# Patient Record
Sex: Female | Born: 1955 | Race: Black or African American | Hispanic: No | Marital: Single | Smoking: Current every day smoker
Health system: Southern US, Community
[De-identification: ages and names within clinical notes are randomized; demographics above are authoritative.]

## PROBLEM LIST (undated history)

## (undated) DIAGNOSIS — I10 Essential (primary) hypertension: Secondary | ICD-10-CM

## (undated) HISTORY — PX: TUBAL LIGATION: SHX77

---

## 2004-02-15 ENCOUNTER — Emergency Department: Payer: Self-pay | Admitting: Emergency Medicine

## 2018-06-12 ENCOUNTER — Emergency Department: Payer: Self-pay

## 2018-06-12 ENCOUNTER — Inpatient Hospital Stay
Admission: EM | Admit: 2018-06-12 | Discharge: 2018-06-15 | DRG: 871 | Disposition: A | Discharge status: Home / Self Care | Payer: Self-pay | Attending: Internal Medicine | Admitting: Internal Medicine

## 2018-06-12 ENCOUNTER — Other Ambulatory Visit: Payer: Self-pay

## 2018-06-12 ENCOUNTER — Encounter: Payer: Self-pay | Admitting: Emergency Medicine

## 2018-06-12 DIAGNOSIS — F17210 Nicotine dependence, cigarettes, uncomplicated: Secondary | ICD-10-CM | POA: Diagnosis present

## 2018-06-12 DIAGNOSIS — E876 Hypokalemia: Secondary | ICD-10-CM | POA: Diagnosis present

## 2018-06-12 DIAGNOSIS — N3090 Cystitis, unspecified without hematuria: Secondary | ICD-10-CM | POA: Diagnosis present

## 2018-06-12 DIAGNOSIS — I10 Essential (primary) hypertension: Secondary | ICD-10-CM | POA: Diagnosis present

## 2018-06-12 DIAGNOSIS — N12 Tubulo-interstitial nephritis, not specified as acute or chronic: Secondary | ICD-10-CM | POA: Diagnosis present

## 2018-06-12 DIAGNOSIS — R1032 Left lower quadrant pain: Secondary | ICD-10-CM

## 2018-06-12 DIAGNOSIS — Z8249 Family history of ischemic heart disease and other diseases of the circulatory system: Secondary | ICD-10-CM

## 2018-06-12 DIAGNOSIS — Z8042 Family history of malignant neoplasm of prostate: Secondary | ICD-10-CM

## 2018-06-12 DIAGNOSIS — N170 Acute kidney failure with tubular necrosis: Secondary | ICD-10-CM | POA: Diagnosis present

## 2018-06-12 DIAGNOSIS — A419 Sepsis, unspecified organism: Secondary | ICD-10-CM | POA: Diagnosis present

## 2018-06-12 DIAGNOSIS — A4151 Sepsis due to Escherichia coli [E. coli]: Principal | ICD-10-CM | POA: Diagnosis present

## 2018-06-12 HISTORY — DX: Essential (primary) hypertension: I10

## 2018-06-12 LAB — URINALYSIS, COMPLETE (UACMP) WITH MICROSCOPIC
Bilirubin Urine: NEGATIVE
Glucose, UA: NEGATIVE mg/dL
Ketones, ur: NEGATIVE mg/dL
Nitrite: POSITIVE — AB
Protein, ur: 100 mg/dL — AB
Specific Gravity, Urine: 1.01 (ref 1.005–1.030)
WBC, UA: >50 WBC/hpf — ABNORMAL HIGH (ref 0–5)
pH: 6 (ref 5.0–8.0)

## 2018-06-12 LAB — COMPREHENSIVE METABOLIC PANEL
ALT: 31 U/L (ref 0–44)
AST: 43 U/L — ABNORMAL HIGH (ref 15–41)
Albumin: 3.6 g/dL (ref 3.5–5.0)
Alkaline Phosphatase: 69 U/L (ref 38–126)
Anion gap: 10 (ref 5–15)
BUN: 20 mg/dL (ref 8–23)
CO2: 23 mmol/L (ref 22–32)
Calcium: 8.8 mg/dL — ABNORMAL LOW (ref 8.9–10.3)
Chloride: 103 mmol/L (ref 98–111)
Creatinine, Ser: 1.34 mg/dL — ABNORMAL HIGH (ref 0.44–1.00)
GFR calc Af Amer: 49 mL/min — ABNORMAL LOW (ref >60)
GFR calc non Af Amer: 42 mL/min — ABNORMAL LOW (ref >60)
GLUCOSE: 160 mg/dL — AB (ref 70–99)
Potassium: 2.8 mmol/L — ABNORMAL LOW (ref 3.5–5.1)
Sodium: 136 mmol/L (ref 135–145)
Total Bilirubin: 1.6 mg/dL — ABNORMAL HIGH (ref 0.3–1.2)
Total Protein: 8 g/dL (ref 6.5–8.1)

## 2018-06-12 LAB — CBC
HCT: 39.2 % (ref 36.0–46.0)
HEMOGLOBIN: 12.9 g/dL (ref 12.0–15.0)
MCH: 30.6 pg (ref 26.0–34.0)
MCHC: 32.9 g/dL (ref 30.0–36.0)
MCV: 92.9 fL (ref 80.0–100.0)
Platelets: 247 10*3/uL (ref 150–400)
RBC: 4.22 MIL/uL (ref 3.87–5.11)
RDW: 14.2 % (ref 11.5–15.5)
WBC: 25.7 10*3/uL — ABNORMAL HIGH (ref 4.0–10.5)
nRBC: 0 % (ref 0.0–0.2)

## 2018-06-12 LAB — LACTIC ACID, PLASMA: Lactic Acid, Venous: 1.5 mmol/L (ref 0.5–1.9)

## 2018-06-12 LAB — TSH: TSH: 0.556 u[IU]/mL (ref 0.350–4.500)

## 2018-06-12 LAB — LIPASE, BLOOD: Lipase: 22 U/L (ref 11–51)

## 2018-06-12 MED ORDER — ONDANSETRON HCL 4 MG/2ML IJ SOLN
4.0000 mg | Freq: Once | INTRAMUSCULAR | Status: AC
  Administered 2018-06-12: 4 mg via INTRAVENOUS
  Filled 2018-06-12: qty 2

## 2018-06-12 MED ORDER — SODIUM CHLORIDE 0.9 % IV BOLUS
1000.0000 mL | Freq: Once | INTRAVENOUS | Status: AC
  Administered 2018-06-12: 1000 mL via INTRAVENOUS

## 2018-06-12 MED ORDER — ENOXAPARIN SODIUM 40 MG/0.4ML ~~LOC~~ SOLN
40.0000 mg | SUBCUTANEOUS | Status: DC
  Administered 2018-06-12 – 2018-06-14 (×3): 40 mg via SUBCUTANEOUS
  Filled 2018-06-12 (×3): qty 0.4

## 2018-06-12 MED ORDER — ONDANSETRON HCL 4 MG PO TABS: 4.0000 mg | ORAL_TABLET | Freq: Four times a day (QID) | ORAL | Status: DC | PRN

## 2018-06-12 MED ORDER — SODIUM CHLORIDE 0.9 % IV SOLN
2.0000 g | INTRAVENOUS | Status: DC
  Administered 2018-06-12: 2 g via INTRAVENOUS
  Filled 2018-06-12 (×2): qty 20

## 2018-06-12 MED ORDER — IOHEXOL 350 MG/ML SOLN: 75.0000 mL | Freq: Once | INTRAVENOUS | Status: DC | PRN

## 2018-06-12 MED ORDER — POTASSIUM CHLORIDE CRYS ER 20 MEQ PO TBCR
40.0000 meq | EXTENDED_RELEASE_TABLET | ORAL | Status: AC
  Administered 2018-06-12 (×2): 40 meq via ORAL
  Filled 2018-06-12 (×2): qty 2

## 2018-06-12 MED ORDER — SODIUM CHLORIDE 0.9 % IV SOLN
INTRAVENOUS | Status: DC
  Administered 2018-06-12 – 2018-06-14 (×4): via INTRAVENOUS

## 2018-06-12 MED ORDER — ACETAMINOPHEN 325 MG PO TABS
650.0000 mg | ORAL_TABLET | Freq: Four times a day (QID) | ORAL | Status: DC | PRN
  Administered 2018-06-12 – 2018-06-14 (×4): 650 mg via ORAL
  Filled 2018-06-12 (×4): qty 2

## 2018-06-12 MED ORDER — IOHEXOL 300 MG/ML  SOLN
75.0000 mL | Freq: Once | INTRAMUSCULAR | Status: AC | PRN
  Administered 2018-06-12: 75 mL via INTRAVENOUS

## 2018-06-12 MED ORDER — MORPHINE SULFATE (PF) 4 MG/ML IV SOLN
4.0000 mg | Freq: Once | INTRAVENOUS | Status: AC
  Administered 2018-06-12: 4 mg via INTRAVENOUS
  Filled 2018-06-12: qty 1

## 2018-06-12 MED ORDER — SODIUM CHLORIDE 0.9 % IV SOLN: 2.0000 g | INTRAVENOUS | Status: DC

## 2018-06-12 MED ORDER — ACETAMINOPHEN 325 MG PO TABS
650.0000 mg | ORAL_TABLET | Freq: Once | ORAL | Status: AC
  Administered 2018-06-12: 650 mg via ORAL
  Filled 2018-06-12: qty 2

## 2018-06-12 MED ORDER — METRONIDAZOLE IN NACL 5-0.79 MG/ML-% IV SOLN
500.0000 mg | Freq: Three times a day (TID) | INTRAVENOUS | Status: DC
  Administered 2018-06-12 – 2018-06-13 (×3): 500 mg via INTRAVENOUS
  Filled 2018-06-12 (×5): qty 100

## 2018-06-12 MED ORDER — ACETAMINOPHEN 650 MG RE SUPP: 650.0000 mg | Freq: Four times a day (QID) | RECTAL | Status: DC | PRN

## 2018-06-12 MED ORDER — SODIUM CHLORIDE 0.9% FLUSH
3.0000 mL | Freq: Once | INTRAVENOUS | Status: AC
  Administered 2018-06-12: 3 mL via INTRAVENOUS

## 2018-06-12 MED ORDER — ONDANSETRON HCL 4 MG/2ML IJ SOLN: 4.0000 mg | Freq: Four times a day (QID) | INTRAMUSCULAR | Status: DC | PRN

## 2018-06-12 MED ORDER — POTASSIUM CHLORIDE 10 MEQ/100ML IV SOLN
10.0000 meq | INTRAVENOUS | Status: DC
  Administered 2018-06-12 (×2): 10 meq via INTRAVENOUS
  Filled 2018-06-12 (×2): qty 100

## 2018-06-12 NOTE — Progress Notes (Signed)
CODE SEPSIS - PHARMACY COMMUNICATION  **Broad Spectrum Antibiotics should be administered within 1 hour of Sepsis diagnosis**  Time Code Sepsis Called/Page Received: 10:53  Antibiotics Ordered: Ceftriaxone  Time of 1st antibiotic administration: 11:36  Additional action taken by pharmacy: N/A  If necessary, Name of Provider/Nurse Contacted: N/A    Foye Deer ,PharmD Clinical Pharmacist  06/12/2018  11:56 AM

## 2018-06-12 NOTE — ED Provider Notes (Signed)
[facility] Emergency Department Provider Note  ____________________________________________  Time seen: Approximately 3:03 PM  I have reviewed the triage vital signs and the nursing notes.   HISTORY  Chief Complaint Abdominal Pain    HPI Ashlee Harper is a 63 y.o. female with a history of hypertension complains of left-sided abdominal pain for the past 2 days, gradual onset, worsening.  Associated with nausea and malaise.  No vomiting diarrhea or constipation.  No aggravating or alleviating factors.  Nonradiating    Past Medical History:  Diagnosis Date  . Hypertension      There are no active problems to display for this patient.    History reviewed. No pertinent surgical history.   Prior to Admission medications   Not on File  Noncontributory   Allergies Patient has no known allergies.   No family history on file.  Social History Social History   Tobacco Use  . Smoking status: Current Every Day Smoker  . Smokeless tobacco: Never Used  Substance Use Topics  . Alcohol use: Never    Frequency: Never  . Drug use: Not on file    Review of Systems  Constitutional:   No fever or chills.  ENT:   No sore throat. No rhinorrhea. Cardiovascular:   No chest pain or syncope. Respiratory:   No dyspnea or cough. Gastrointestinal: Positive as above for abdominal pain without vomiting and diarrhea.  Musculoskeletal:   Negative for focal pain or swelling All other systems reviewed and are negative except as documented above in ROS and HPI.  ____________________________________________   PHYSICAL EXAM:  VITAL SIGNS: ED Triage Vitals  Enc Vitals Group     BP 06/12/18 0839 130/74     Pulse Rate 06/12/18 0839 (!) 119     Resp 06/12/18 0839 16     Temp 06/12/18 0839 99.7 F (37.6 C)     Temp Source 06/12/18 0839 Oral     SpO2 06/12/18 0839 99 %     Weight 06/12/18 0840 130 lb (59 kg)     Height 06/12/18 0840 5\' 2"  (1.575 m)      Head Circumference --      Peak Flow --      Pain Score 06/12/18 0840 8     Pain Loc --      Pain Edu? --      Excl. in GC? --     Vital signs reviewed, nursing assessments reviewed.   Constitutional:   Alert and oriented. Non-toxic appearance. Eyes:   Conjunctivae are normal. EOMI. PERRL. ENT      Head:   Normocephalic and atraumatic.      Nose:   No congestion/rhinnorhea.       Mouth/Throat:   Dry mucous membranes, no pharyngeal erythema. No peritonsillar mass.       Neck:   No meningismus. Full ROM. Hematological/Lymphatic/Immunilogical:   No cervical lymphadenopathy. Cardiovascular:   Tachycardia rate 100. Symmetric bilateral radial and DP pulses.  No murmurs. Cap refill less than 2 seconds. Respiratory:   Normal respiratory effort without tachypnea/retractions. Breath sounds are clear and equal bilaterally. No wheezes/rales/rhonchi. Gastrointestinal:   Soft with diffuse left-sided abdominal tenderness. Non distended. There is no CVA tenderness.  No rebound, rigidity, or guarding. Musculoskeletal:   Normal range of motion in all extremities. No joint effusions.  No lower extremity tenderness.  No edema. Neurologic:   Normal speech and language.  Motor grossly intact. No acute focal neurologic deficits are appreciated.  Skin:  Skin is warm, dry and intact. No rash noted.  No petechiae, purpura, or bullae.  ____________________________________________    LABS (pertinent positives/negatives) (all labs ordered are listed, but only abnormal results are displayed) Labs Reviewed  COMPREHENSIVE METABOLIC PANEL - Abnormal; Notable for the following components:      Result Value   Potassium 2.8 (*)    Glucose, Bld 160 (*)    Creatinine, Ser 1.34 (*)    Calcium 8.8 (*)    AST 43 (*)    Total Bilirubin 1.6 (*)    GFR calc non Af Amer 42 (*)    GFR calc Af Amer 49 (*)    All other components within normal limits  CBC - Abnormal; Notable for the following components:   WBC 25.7  (*)    All other components within normal limits  URINE CULTURE  LIPASE, BLOOD  LACTIC ACID, PLASMA  URINALYSIS, COMPLETE (UACMP) WITH MICROSCOPIC   ____________________________________________   EKG    ____________________________________________    RADIOLOGY  Ct Abdomen Pelvis W Contrast  Result Date: 06/12/2018 CLINICAL DATA:  Left-sided pain for 2 days. Denies urinary symptoms. Normal bowel movements. Current smoker. EXAM: CT ABDOMEN AND PELVIS WITH CONTRAST TECHNIQUE: Multidetector CT imaging of the abdomen and pelvis was performed using the standard protocol following bolus administration of intravenous contrast. CONTRAST:  53mL OMNIPAQUE IOHEXOL 300 MG/ML  SOLN COMPARISON:  None. FINDINGS: Lower chest: Bibasilar subsegmental atelectasis. Normal heart size with trace left pleural fluid. Hepatobiliary: Normal liver. Normal gallbladder, without biliary duct dilatation. Pancreas: Normal, without mass or ductal dilatation. Spleen: Normal in size, without focal abnormality. Adrenals/Urinary Tract: Mild left greater than right adrenal thickening. Lower pole right renal 1.3 cm cyst. Upper pole left renal 4.0 cm cyst. Edema of the left kidney, with relatively diffuse heterogeneous enhancement. Most apparent on delayed images. More mild heterogeneous enhancement of the right kidney, including anteriorly on image 14/7. Left perinephric interstitial thickening.  No hydronephrosis. Air within the urinary bladder with diffuse mild pelvic edema. Example image 69/2. Bladder dome wall thickening, eccentric left on coronal image 30. Stomach/Bowel: Normal stomach, without wall thickening. Colonic diverticulosis, especially in the sigmoid. Possible extraluminal gas between the sigmoid and bladder on image 67/2. Normal small bowel. Vascular/Lymphatic: Advanced aortic and branch vessel atherosclerosis. No abdominopelvic adenopathy. Reproductive: Normal uterus and adnexa. Other: No significant free fluid.   Moderate pelvic floor laxity. Musculoskeletal: Lumbosacral spondylosis, including degenerative disc disease at L4-5 and a disc bulge at L3-4. IMPRESSION: 1. Left greater than right pyelonephritis. 2. Bladder wall thickening and diffuse pelvic edema, likely indicative of cystitis. Air within the bladder. Unless there has been recent instrumentation, this would suggest fistulous communication to bowel, presumably secondary to diverticulitis. 3.  Aortic Atherosclerosis (ICD10-I70.0). 4. Pelvic floor laxity. Electronically Signed   By: Jeronimo Greaves M.D.   On: 06/12/2018 15:02    ____________________________________________   PROCEDURES .Critical Care Performed by: Sharman Cheek, MD Authorized by: Sharman Cheek, MD   Critical care provider statement:    Critical care time (minutes):  35   Critical care time was exclusive of:  Separately billable procedures and treating other patients   Critical care was necessary to treat or prevent imminent or life-threatening deterioration of the following conditions:  Sepsis   Critical care was time spent personally by me on the following activities:  Development of treatment plan with patient or surrogate, discussions with consultants, evaluation of patient's response to treatment, examination of patient, obtaining history from patient or surrogate, ordering  and performing treatments and interventions, ordering and review of laboratory studies, ordering and review of radiographic studies, pulse oximetry, re-evaluation of patient's condition and review of old charts    ____________________________________________  DIFFERENTIAL DIAGNOSIS   Diverticulitis, bowel obstruction, bowel perforation, intra-abdominal abscess, pancreatitis.  Doubt AAA dissection or mesenteric ischemia.  Doubt biliary disease or appendicitis.  CLINICAL IMPRESSION / ASSESSMENT AND PLAN / ED COURSE  Pertinent labs & imaging results that were available during my care of the patient  were reviewed by me and considered in my medical decision making (see chart for details).      Clinical Course as of Jun 13 1527  Tue Jun 12, 2018  1051 Temp 102.2 on my exam. Will check lactate, b. Cx, give antibiotics and obtain CT.    [PS]  1148 Nurse reports only 1 set of cultures obtained due to inability to obtain second set after 3 additional attempts. Antibiotics initiated to avoid delay.   [PS]  1507 CT shows bilateral pyelonephritis.  With her signs of sepsis including severe leukocytosis, fever, tachycardia, patient will need to be hospitalized for further management.  Continue IV fluids for hydration.   [PS]    Clinical Course User Index [PS] Sharman CheekStafford, Vasily Fedewa, MD     ____________________________________________   FINAL CLINICAL IMPRESSION(S) / ED DIAGNOSES    Final diagnoses:  Left lower quadrant abdominal pain  Sepsis, due to unspecified organism, unspecified whether acute organ dysfunction present [facility](HCC)     ED Discharge Orders    None      Portions of this note were generated with dragon dictation software. Dictation errors may occur despite best attempts at proofreading.   Sharman CheekStafford, Adasia Hoar, MD 06/12/18 [id number]

## 2018-06-12 NOTE — H&P (Signed)
[employer] [facility] [facility] [place] [facility]   PATIENT NAME: Ashlee Harper    MR#:  [id number]  DATE OF BIRTH:  04/20/1956  DATE OF ADMISSION:  06/12/2018  PRIMARY CARE PHYSICIAN: Patient, No Pcp Per   REQUESTING/REFERRING PHYSICIAN: Dr. Sharman Cheek  CHIEF COMPLAINT:   Chief Complaint  Patient presents with  . Abdominal Pain    HISTORY OF PRESENT ILLNESS:  Ashlee Harper  is a 63 y.o. female with a known history of no significant past medical history, independent at baseline brought to the hospital by family secondary to worsening fatigue and weakness and chills. Patient is a poor historian.  She says for the last 5 days she has not been feeling well.  Denies any fevers but had chills for the last couple of days.  No nausea or vomiting with decreased oral intake and fatigue.  She denies any dysuria or hematuria.  Denies any decreased or increased urinary frequency.  Complaining of left flank pain since yesterday. Urine looked dirty on collection, patient has an increased white count here.  Labs with low potassium and mild renal insufficiency.  CT of the abdomen showing left greater than right pyelonephritis with cystitis and bladder wall thickening.   PAST MEDICAL HISTORY:   Past Medical History:  Diagnosis Date  . Hypertension     PAST SURGICAL HISTORY:   Past Surgical History:  Procedure Laterality Date  . TUBAL LIGATION      SOCIAL HISTORY:   Social History   Tobacco Use  . Smoking status: Current Every Day Smoker  . Smokeless tobacco: Never Used  . Tobacco comment: 3 cigarettes per day  Substance Use Topics  . Alcohol use: Never    Frequency: Never    FAMILY HISTORY:   Family History  Problem Relation Age of Onset  . CAD Mother   . Prostate cancer Father     DRUG ALLERGIES:  No Known Allergies  REVIEW OF SYSTEMS:   Review of Systems  Constitutional: Positive for chills and malaise/fatigue. Negative for fever and weight loss.    HENT: Negative for ear discharge, ear pain, hearing loss, nosebleeds and tinnitus.   Eyes: Negative for blurred vision, double vision and photophobia.  Respiratory: Negative for cough, hemoptysis, shortness of breath and wheezing.   Cardiovascular: Negative for chest pain, palpitations, orthopnea and leg swelling.  Gastrointestinal: Negative for abdominal pain, constipation, diarrhea, heartburn, melena, nausea and vomiting.  Genitourinary: Negative for dysuria, frequency, hematuria and urgency.  Musculoskeletal: Negative for back pain, myalgias and neck pain.  Skin: Negative for rash.  Neurological: Positive for weakness. Negative for dizziness, tingling, tremors, sensory change, speech change, focal weakness and headaches.  Endo/Heme/Allergies: Does not bruise/bleed easily.  Psychiatric/Behavioral: Negative for depression.    MEDICATIONS AT HOME:   Prior to Admission medications   Not on File      VITAL SIGNS:  Blood pressure (!) 161/95, pulse 90, temperature (!) 102.2 F (39 C), temperature source Oral, resp. rate 19, height 5\' 2"  (1.575 m), weight 59 kg, SpO2 96 %.  PHYSICAL EXAMINATION:   Physical Exam  GENERAL:  63 y.o.-year-old patient lying in the bed with no acute distress.  EYES: Pupils equal, round, reactive to light and accommodation. No scleral icterus. Extraocular muscles intact.  HEENT: Head atraumatic, normocephalic. Oropharynx and nasopharynx clear. Dry mucus membranes Poor dentition NECK:  Supple, no jugular venous distention. No thyroid enlargement, no tenderness.  LUNGS: Normal breath sounds bilaterally, no wheezing, rales,rhonchi or crepitation. No use of  accessory muscles of respiration.  CARDIOVASCULAR: S1, S2 normal. No murmurs, rubs, or gallops.  ABDOMEN: Soft, nontender, nondistended. Bowel sounds present. No organomegaly or mass.  EXTREMITIES: No pedal edema, cyanosis, or clubbing.  NEUROLOGIC: Cranial nerves II through XII are intact. Muscle strength  5/5 in all extremities. Sensation intact. Gait not checked. Global weakness noted PSYCHIATRIC: The patient is alert and oriented x 3.  SKIN: No obvious rash, lesion, or ulcer.   LABORATORY PANEL:   CBC Recent Labs  Lab 06/12/18 0848  WBC 25.7*  HGB 12.9  HCT 39.2  PLT 247   ------------------------------------------------------------------------------------------------------------------  Chemistries  Recent Labs  Lab 06/12/18 0848  NA 136  K 2.8*  CL 103  CO2 23  GLUCOSE 160*  BUN 20  CREATININE 1.34*  CALCIUM 8.8*  AST 43*  ALT 31  ALKPHOS 69  BILITOT 1.6*   ------------------------------------------------------------------------------------------------------------------  Cardiac Enzymes No results for input(s): TROPONINI in the last 168 hours. ------------------------------------------------------------------------------------------------------------------  RADIOLOGY:  Ct Abdomen Pelvis W Contrast  Result Date: 06/12/2018 CLINICAL DATA:  Left-sided pain for 2 days. Denies urinary symptoms. Normal bowel movements. Current smoker. EXAM: CT ABDOMEN AND PELVIS WITH CONTRAST TECHNIQUE: Multidetector CT imaging of the abdomen and pelvis was performed using the standard protocol following bolus administration of intravenous contrast. CONTRAST:  38mL OMNIPAQUE IOHEXOL 300 MG/ML  SOLN COMPARISON:  None. FINDINGS: Lower chest: Bibasilar subsegmental atelectasis. Normal heart size with trace left pleural fluid. Hepatobiliary: Normal liver. Normal gallbladder, without biliary duct dilatation. Pancreas: Normal, without mass or ductal dilatation. Spleen: Normal in size, without focal abnormality. Adrenals/Urinary Tract: Mild left greater than right adrenal thickening. Lower pole right renal 1.3 cm cyst. Upper pole left renal 4.0 cm cyst. Edema of the left kidney, with relatively diffuse heterogeneous enhancement. Most apparent on delayed images. More mild heterogeneous enhancement of  the right kidney, including anteriorly on image 14/7. Left perinephric interstitial thickening.  No hydronephrosis. Air within the urinary bladder with diffuse mild pelvic edema. Example image 69/2. Bladder dome wall thickening, eccentric left on coronal image 30. Stomach/Bowel: Normal stomach, without wall thickening. Colonic diverticulosis, especially in the sigmoid. Possible extraluminal gas between the sigmoid and bladder on image 67/2. Normal small bowel. Vascular/Lymphatic: Advanced aortic and branch vessel atherosclerosis. No abdominopelvic adenopathy. Reproductive: Normal uterus and adnexa. Other: No significant free fluid.  Moderate pelvic floor laxity. Musculoskeletal: Lumbosacral spondylosis, including degenerative disc disease at L4-5 and a disc bulge at L3-4. IMPRESSION: 1. Left greater than right pyelonephritis. 2. Bladder wall thickening and diffuse pelvic edema, likely indicative of cystitis. Air within the bladder. Unless there has been recent instrumentation, this would suggest fistulous communication to bowel, presumably secondary to diverticulitis. 3.  Aortic Atherosclerosis (ICD10-I70.0). 4. Pelvic floor laxity. Electronically Signed   By: Jeronimo Greaves M.D.   On: 06/12/2018 15:02    EKG:   Orders placed or performed during the hospital encounter of 06/12/18  . ED EKG  . ED EKG    IMPRESSION AND PLAN:   Zaylia Saladino  is a 63 y.o. female with a known history of no significant past medical history, independent at baseline brought to the hospital by family secondary to worsening fatigue and weakness and chills.  1.  Sepsis-secondary to bilateral pyelonephritis. -Blood cultures and urine cultures ordered -Started on Rocephin daily for now. -Monitor urine output.  IV fluids  2.  Hypokalemia-being replaced aggressively  3.  Mild renal insufficiency-ATN and prerenal causes -IV fluids, avoid nephrotoxins and monitor  4.  DVT prophylaxis-Lovenox  All the records are reviewed  and case discussed with ED provider. Management plans discussed with the patient, family and they are in agreement.  CODE STATUS: Full Code  TOTAL TIME TAKING CARE OF THIS PATIENT: 51 minutes.    Enid Baasadhika Sallye Lunz M.D on 06/12/2018 at 3:56 PM  Between 7am to 6pm - Pager - [phone]  After 6pm go to [email or web address] - password [email or web address]  [facility]  Office  [phone]  CC: Primary care physician; Patient, No Pcp Per

## 2018-06-12 NOTE — ED Triage Notes (Signed)
Left side abd pain for 2 days.  Denies urinary symptoms.  Says normal bowel movements.

## 2018-06-13 LAB — BASIC METABOLIC PANEL
Anion gap: 8 (ref 5–15)
BUN: 21 mg/dL (ref 8–23)
CO2: 21 mmol/L — ABNORMAL LOW (ref 22–32)
CREATININE: 1.23 mg/dL — AB (ref 0.44–1.00)
Calcium: 8 mg/dL — ABNORMAL LOW (ref 8.9–10.3)
Chloride: 112 mmol/L — ABNORMAL HIGH (ref 98–111)
GFR calc Af Amer: 54 mL/min — ABNORMAL LOW (ref >60)
GFR calc non Af Amer: 47 mL/min — ABNORMAL LOW (ref >60)
Glucose, Bld: 126 mg/dL — ABNORMAL HIGH (ref 70–99)
Potassium: 4.4 mmol/L (ref 3.5–5.1)
Sodium: 141 mmol/L (ref 135–145)

## 2018-06-13 LAB — BLOOD CULTURE ID PANEL (REFLEXED)
Acinetobacter baumannii: NOT DETECTED
CARBAPENEM RESISTANCE: NOT DETECTED
Candida albicans: NOT DETECTED
Candida glabrata: NOT DETECTED
Candida krusei: NOT DETECTED
Candida parapsilosis: NOT DETECTED
Candida tropicalis: NOT DETECTED
Enterobacter cloacae complex: NOT DETECTED
Enterobacteriaceae species: DETECTED — AB
Enterococcus species: NOT DETECTED
Escherichia coli: DETECTED — AB
Haemophilus influenzae: NOT DETECTED
Klebsiella oxytoca: NOT DETECTED
Klebsiella pneumoniae: NOT DETECTED
Listeria monocytogenes: NOT DETECTED
Neisseria meningitidis: NOT DETECTED
Proteus species: NOT DETECTED
Pseudomonas aeruginosa: NOT DETECTED
STAPHYLOCOCCUS SPECIES: NOT DETECTED
Serratia marcescens: NOT DETECTED
Staphylococcus aureus (BCID): NOT DETECTED
Streptococcus agalactiae: NOT DETECTED
Streptococcus pneumoniae: NOT DETECTED
Streptococcus pyogenes: NOT DETECTED
Streptococcus species: NOT DETECTED

## 2018-06-13 LAB — URINE CULTURE: Culture: <10000 — AB

## 2018-06-13 LAB — CBC
HCT: 32.9 % — ABNORMAL LOW (ref 36.0–46.0)
Hemoglobin: 10.7 g/dL — ABNORMAL LOW (ref 12.0–15.0)
MCH: 30.7 pg (ref 26.0–34.0)
MCHC: 32.5 g/dL (ref 30.0–36.0)
MCV: 94.3 fL (ref 80.0–100.0)
Platelets: 178 10*3/uL (ref 150–400)
RBC: 3.49 MIL/uL — ABNORMAL LOW (ref 3.87–5.11)
RDW: 14.5 % (ref 11.5–15.5)
WBC: 22.5 10*3/uL — ABNORMAL HIGH (ref 4.0–10.5)
nRBC: 0 % (ref 0.0–0.2)

## 2018-06-13 MED ORDER — POTASSIUM CHLORIDE 10 MEQ/100ML IV SOLN
10.0000 meq | INTRAVENOUS | Status: DC
  Administered 2018-06-13: 03:00:00 10 meq via INTRAVENOUS
  Filled 2018-06-13 (×2): qty 100

## 2018-06-13 MED ORDER — SODIUM CHLORIDE 0.9 % IV SOLN
INTRAVENOUS | Status: DC | PRN
  Administered 2018-06-13 (×2): 500 mL via INTRAVENOUS
  Administered 2018-06-15: 20 mL via INTRAVENOUS

## 2018-06-13 MED ORDER — SODIUM CHLORIDE 0.9 % IV SOLN
1.0000 g | Freq: Two times a day (BID) | INTRAVENOUS | Status: DC
  Administered 2018-06-13 – 2018-06-15 (×5): 1 g via INTRAVENOUS
  Filled 2018-06-13 (×6): qty 1

## 2018-06-13 NOTE — Progress Notes (Signed)
[facility] - [place] at [facility]   PATIENT NAME: Ashlee Harper    MR#:  [id number]  DATE OF BIRTH:  02/24/1956  SUBJECTIVE:   Patient came in with generalized weakness not feeling well found to have urinary tract infection with pyelonephritis denies any complaints at present. No family in the room. Had high-grade fever. REVIEW OF SYSTEMS:   Review of Systems  Constitutional: Positive for fever. Negative for chills and weight loss.  HENT: Negative for ear discharge, ear pain and nosebleeds.   Eyes: Negative for blurred vision, pain and discharge.  Respiratory: Negative for sputum production, shortness of breath, wheezing and stridor.   Cardiovascular: Negative for chest pain, palpitations, orthopnea and PND.  Gastrointestinal: Negative for abdominal pain, diarrhea, nausea and vomiting.  Genitourinary: Positive for dysuria. Negative for frequency and urgency.  Musculoskeletal: Negative for back pain and joint pain.  Neurological: Negative for sensory change, speech change, focal weakness and weakness.  Psychiatric/Behavioral: Negative for depression and hallucinations. The patient is not nervous/anxious.    Tolerating Diet: yes Tolerating PT: ambulatory  DRUG ALLERGIES:  No Known Allergies  VITALS:  Blood pressure 116/73, pulse 68, temperature 98.4 F (36.9 C), temperature source Oral, resp. rate 18, height 5\' 2"  (1.575 m), weight 59 kg, SpO2 99 %.  PHYSICAL EXAMINATION:   Physical Exam  GENERAL:  63 y.o.-year-old patient lying in the bed with no acute distress.  EYES: Pupils equal, round, reactive to light and accommodation. No scleral icterus. Extraocular muscles intact.  HEENT: Head atraumatic, normocephalic. Oropharynx and nasopharynx clear.  NECK:  Supple, no jugular venous distention. No thyroid enlargement, no tenderness.  LUNGS: Normal breath sounds bilaterally, no wheezing, rales, rhonchi. No use of accessory muscles of respiration.   CARDIOVASCULAR: S1, S2 normal. No murmurs, rubs, or gallops.  ABDOMEN: Soft, nontender, nondistended. Bowel sounds present. No organomegaly or mass.  EXTREMITIES: No cyanosis, clubbing or edema b/l.    NEUROLOGIC: Cranial nerves II through XII are intact. No focal Motor or sensory deficits b/l.   PSYCHIATRIC:  patient is alert and oriented x 3.  SKIN: No obvious rash, lesion, or ulcer.   LABORATORY PANEL:  CBC Recent Labs  Lab 06/13/18 0326  WBC 22.5*  HGB 10.7*  HCT 32.9*  PLT 178    Chemistries  Recent Labs  Lab 06/12/18 0848 06/13/18 0326  NA 136 141  K 2.8* 4.4  CL 103 112*  CO2 23 21*  GLUCOSE 160* 126*  BUN 20 21  CREATININE 1.34* 1.23*  CALCIUM 8.8* 8.0*  AST 43*  --   ALT 31  --   ALKPHOS 69  --   BILITOT 1.6*  --    Cardiac Enzymes No results for input(s): TROPONINI in the last 168 hours. RADIOLOGY:  Ct Abdomen Pelvis W Contrast  Result Date: 06/12/2018 CLINICAL DATA:  Left-sided pain for 2 days. Denies urinary symptoms. Normal bowel movements. Current smoker. EXAM: CT ABDOMEN AND PELVIS WITH CONTRAST TECHNIQUE: Multidetector CT imaging of the abdomen and pelvis was performed using the standard protocol following bolus administration of intravenous contrast. CONTRAST:  81mL OMNIPAQUE IOHEXOL 300 MG/ML  SOLN COMPARISON:  None. FINDINGS: Lower chest: Bibasilar subsegmental atelectasis. Normal heart size with trace left pleural fluid. Hepatobiliary: Normal liver. Normal gallbladder, without biliary duct dilatation. Pancreas: Normal, without mass or ductal dilatation. Spleen: Normal in size, without focal abnormality. Adrenals/Urinary Tract: Mild left greater than right adrenal thickening. Lower pole right renal 1.3 cm cyst. Upper pole left renal 4.0 cm cyst.  Edema of the left kidney, with relatively diffuse heterogeneous enhancement. Most apparent on delayed images. More mild heterogeneous enhancement of the right kidney, including anteriorly on image 14/7. Left  perinephric interstitial thickening.  No hydronephrosis. Air within the urinary bladder with diffuse mild pelvic edema. Example image 69/2. Bladder dome wall thickening, eccentric left on coronal image 30. Stomach/Bowel: Normal stomach, without wall thickening. Colonic diverticulosis, especially in the sigmoid. Possible extraluminal gas between the sigmoid and bladder on image 67/2. Normal small bowel. Vascular/Lymphatic: Advanced aortic and branch vessel atherosclerosis. No abdominopelvic adenopathy. Reproductive: Normal uterus and adnexa. Other: No significant free fluid.  Moderate pelvic floor laxity. Musculoskeletal: Lumbosacral spondylosis, including degenerative disc disease at L4-5 and a disc bulge at L3-4. IMPRESSION: 1. Left greater than right pyelonephritis. 2. Bladder wall thickening and diffuse pelvic edema, likely indicative of cystitis. Air within the bladder. Unless there has been recent instrumentation, this would suggest fistulous communication to bowel, presumably secondary to diverticulitis. 3.  Aortic Atherosclerosis (ICD10-I70.0). 4. Pelvic floor laxity. Electronically Signed   By: Jeronimo Greaves M.D.   On: 06/12/2018 15:02   ASSESSMENT AND PLAN:   Ashlee Harper  is a 63 y.o. female with a known history of no significant past medical history, independent at baseline brought to the hospital by family secondary to worsening fatigue and weakness and chills.  1.  Sepsis-secondary to bilateral pyelonephritis (noted on CT abdomen). -Blood cultures positive for E. coli. And urine cultures use insignificant growth. -Patient now on IV Meropenem - IV fluids  2.  Hypokalemia-being replaced aggressively  3.  Mild renal insufficiency-ATN and prerenal causes -IV fluids, avoid nephrotoxins and monitor  4.  DVT prophylaxis-Lovenox  Case discussed with Care Management/Social Worker. Management plans discussed with the patient, family and they are in agreement.  CODE STATUS: full DVT  Prophylaxis: **lovenox*  TOTAL TIME TAKING CARE OF THIS PATIENT: *30* minutes.  >50% time spent on counselling and coordination of care  POSSIBLE D/C IN *1-2* DAYS, DEPENDING ON CLINICAL CONDITION.  Note: This dictation was prepared with Dragon dictation along with smaller phrase technology. Any transcriptional errors that result from this process are unintentional.  Enedina Finner M.D on 06/13/2018 at 1:34 PM  Between 7am to 6pm - Pager - [phone]  After 6pm go to [email or web address] - password [email or web address]  [facility]  Office  [phone]  CC: Primary care physician; Patient, No Pcp PerPatient ID: Nicholaus Corolla, female   DOB: 07/16/1955, 63 y.o.   MRN: [id number]

## 2018-06-13 NOTE — Progress Notes (Signed)
PHARMACY - PHYSICIAN COMMUNICATION CRITICAL VALUE ALERT - BLOOD CULTURE IDENTIFICATION (BCID)  Ashlee Harper is an 63 y.o. female who presented to [facility] on 06/12/2018 with a chief complaint of   Assessment:  1/2 E. Coli KPC(-) (include suspected source if known)  Name of physician (or Provider) Contacted: Gouru  Current antibiotics: ceftriaxone, metronidazole  Changes to prescribed antibiotics recommended:  Ceftriaxone >> meropenem  Results for orders placed or performed during the hospital encounter of 06/12/18  Blood Culture ID Panel (Reflexed) (Collected: 06/12/2018 11:37 AM)  Result Value Ref Range   Enterococcus species NOT DETECTED NOT DETECTED   Listeria monocytogenes NOT DETECTED NOT DETECTED   Staphylococcus species NOT DETECTED NOT DETECTED   Staphylococcus aureus (BCID) NOT DETECTED NOT DETECTED   Streptococcus species NOT DETECTED NOT DETECTED   Streptococcus agalactiae NOT DETECTED NOT DETECTED   Streptococcus pneumoniae NOT DETECTED NOT DETECTED   Streptococcus pyogenes NOT DETECTED NOT DETECTED   Acinetobacter baumannii NOT DETECTED NOT DETECTED   Enterobacteriaceae species DETECTED (A) NOT DETECTED   Enterobacter cloacae complex NOT DETECTED NOT DETECTED   Escherichia coli DETECTED (A) NOT DETECTED   Klebsiella oxytoca NOT DETECTED NOT DETECTED   Klebsiella pneumoniae NOT DETECTED NOT DETECTED   Proteus species NOT DETECTED NOT DETECTED   Serratia marcescens NOT DETECTED NOT DETECTED   Carbapenem resistance NOT DETECTED NOT DETECTED   Haemophilus influenzae NOT DETECTED NOT DETECTED   Neisseria meningitidis NOT DETECTED NOT DETECTED   Pseudomonas aeruginosa NOT DETECTED NOT DETECTED   Candida albicans NOT DETECTED NOT DETECTED   Candida glabrata NOT DETECTED NOT DETECTED   Candida krusei NOT DETECTED NOT DETECTED   Candida parapsilosis NOT DETECTED NOT DETECTED   Candida tropicalis NOT DETECTED NOT DETECTED    Koree Staheli S 06/13/2018  1:28 AM

## 2018-06-14 LAB — HIV ANTIBODY (ROUTINE TESTING W REFLEX): HIV Screen 4th Generation wRfx: NONREACTIVE

## 2018-06-14 LAB — CBC
HCT: 28.9 % — ABNORMAL LOW (ref 36.0–46.0)
Hemoglobin: 9.5 g/dL — ABNORMAL LOW (ref 12.0–15.0)
MCH: 30.6 pg (ref 26.0–34.0)
MCHC: 32.9 g/dL (ref 30.0–36.0)
MCV: 93.2 fL (ref 80.0–100.0)
Platelets: 151 10*3/uL (ref 150–400)
RBC: 3.1 MIL/uL — ABNORMAL LOW (ref 3.87–5.11)
RDW: 14.6 % (ref 11.5–15.5)
WBC: 15.7 10*3/uL — ABNORMAL HIGH (ref 4.0–10.5)
nRBC: 0 % (ref 0.0–0.2)

## 2018-06-14 NOTE — Progress Notes (Signed)
[facility] -  at [facility]   PATIENT NAME: Ashlee Harper    MR#:  [id number]  DATE OF BIRTH:  1955-12-28  SUBJECTIVE:   Had low-grade fever this morning. Cousin sister in the room. Denies any complaints. No nausea vomiting and abdominal pain. REVIEW OF SYSTEMS:   Review of Systems  Constitutional: Positive for fever. Negative for chills and weight loss.  HENT: Negative for ear discharge, ear pain and nosebleeds.   Eyes: Negative for blurred vision, pain and discharge.  Respiratory: Negative for sputum production, shortness of breath, wheezing and stridor.   Cardiovascular: Negative for chest pain, palpitations, orthopnea and PND.  Gastrointestinal: Negative for abdominal pain, diarrhea, nausea and vomiting.  Genitourinary: Positive for dysuria. Negative for frequency and urgency.  Musculoskeletal: Negative for back pain and joint pain.  Neurological: Negative for sensory change, speech change, focal weakness and weakness.  Psychiatric/Behavioral: Negative for depression and hallucinations. The patient is not nervous/anxious.    Tolerating Diet: yes Tolerating PT: ambulatory  DRUG ALLERGIES:  No Known Allergies  VITALS:  Blood pressure (!) 160/89, pulse 79, temperature 98.8 F (37.1 C), resp. rate 16, height 5\' 2"  (1.575 m), weight 59 kg, SpO2 99 %.  PHYSICAL EXAMINATION:   Physical Exam  GENERAL:  63 y.o.-year-old patient lying in the bed with no acute distress.  EYES: Pupils equal, round, reactive to light and accommodation. No scleral icterus. Extraocular muscles intact.  HEENT: Head atraumatic, normocephalic. Oropharynx and nasopharynx clear.  NECK:  Supple, no jugular venous distention. No thyroid enlargement, no tenderness.  LUNGS: Normal breath sounds bilaterally, no wheezing, rales, rhonchi. No use of accessory muscles of respiration.  CARDIOVASCULAR: S1, S2 normal. No murmurs, rubs, or gallops.  ABDOMEN: Soft, nontender,  nondistended. Bowel sounds present. No organomegaly or mass.  EXTREMITIES: No cyanosis, clubbing or edema b/l.    NEUROLOGIC: Cranial nerves II through XII are intact. No focal Motor or sensory deficits b/l.   PSYCHIATRIC:  patient is alert and oriented x 3.  SKIN: No obvious rash, lesion, or ulcer.   LABORATORY PANEL:  CBC Recent Labs  Lab 06/14/18 0816  WBC 15.7*  HGB 9.5*  HCT 28.9*  PLT 151    Chemistries  Recent Labs  Lab 06/12/18 0848 06/13/18 0326  NA 136 141  K 2.8* 4.4  CL 103 112*  CO2 23 21*  GLUCOSE 160* 126*  BUN 20 21  CREATININE 1.34* 1.23*  CALCIUM 8.8* 8.0*  AST 43*  --   ALT 31  --   ALKPHOS 69  --   BILITOT 1.6*  --    Cardiac Enzymes No results for input(s): TROPONINI in the last 168 hours. RADIOLOGY:  No results found. ASSESSMENT AND PLAN:   Ashlee Harper  is a 63 y.o. female with a known history of no significant past medical history, independent at baseline brought to the hospital by family secondary to worsening fatigue and weakness and chills.  1.  Sepsis-secondary to bilateral pyelonephritis (noted on CT abdomen). -Blood cultures positive for E. coli. Sensitivity spent eight. And urine cultures use insignificant growth. -Patient now on IV Meropenem -  received IV fluids -WBC count improving  2.  Hypokalemia-being replaced aggressively  3.  Mild renal insufficiency-ATN and prerenal causes -IV fluids, avoid nephrotoxins and monitor  4.  DVT prophylaxis-Lovenox   Case discussed with Care Management/Social Worker. Management plans discussed with the patient, family and they are in agreement.  If continues to show improvement will discharged tomorrow to  home. Patient agreeable  CODE STATUS: full DVT Prophylaxis: **lovenox*  TOTAL TIME TAKING CARE OF THIS PATIENT: *30* minutes.  >50% time spent on counselling and coordination of care  POSSIBLE D/C IN *1-2* DAYS, DEPENDING ON CLINICAL CONDITION.  Note: This dictation was  prepared with Dragon dictation along with smaller phrase technology. Any transcriptional errors that result from this process are unintentional.  Enedina Finner M.D on 06/14/2018 at 2:26 PM  Between 7am to 6pm - Pager - [phone]  After 6pm go to [email or web address] - password [email or web address]  [facility]  Office  [phone]  CC: Primary care physician; Patient, No Pcp PerPatient ID: Ashlee Harper, female   DOB: 1955-10-29, 63 y.o.   MRN: [id number]

## 2018-06-15 LAB — CULTURE, BLOOD (SINGLE)

## 2018-06-15 LAB — BASIC METABOLIC PANEL
Anion gap: 5 (ref 5–15)
BUN: 9 mg/dL (ref 8–23)
CO2: 21 mmol/L — ABNORMAL LOW (ref 22–32)
Calcium: 7.9 mg/dL — ABNORMAL LOW (ref 8.9–10.3)
Chloride: 114 mmol/L — ABNORMAL HIGH (ref 98–111)
Creatinine, Ser: 0.85 mg/dL (ref 0.44–1.00)
GFR calc Af Amer: >60 mL/min (ref >60)
GFR calc non Af Amer: >60 mL/min (ref >60)
GLUCOSE: 93 mg/dL (ref 70–99)
Potassium: 3.2 mmol/L — ABNORMAL LOW (ref 3.5–5.1)
Sodium: 140 mmol/L (ref 135–145)

## 2018-06-15 MED ORDER — CEPHALEXIN 500 MG PO CAPS
500.0000 mg | ORAL_CAPSULE | Freq: Four times a day (QID) | ORAL | Status: DC
  Administered 2018-06-15: 500 mg via ORAL
  Filled 2018-06-15: qty 1

## 2018-06-15 MED ORDER — CEFAZOLIN SODIUM-DEXTROSE 2-4 GM/100ML-% IV SOLN
2.0000 g | Freq: Three times a day (TID) | INTRAVENOUS | Status: DC
  Filled 2018-06-15 (×3): qty 100

## 2018-06-15 MED ORDER — POTASSIUM CHLORIDE CRYS ER 20 MEQ PO TBCR
40.0000 meq | EXTENDED_RELEASE_TABLET | Freq: Once | ORAL | Status: DC
  Filled 2018-06-15: qty 2

## 2018-06-15 MED ORDER — CEPHALEXIN 500 MG PO CAPS: 500.0000 mg | ORAL_CAPSULE | Freq: Four times a day (QID) | ORAL | 0 refills | Status: AC

## 2018-06-15 MED ORDER — POTASSIUM CHLORIDE CRYS ER 20 MEQ PO TBCR
40.0000 meq | EXTENDED_RELEASE_TABLET | ORAL | Status: AC
  Administered 2018-06-15 (×2): 40 meq via ORAL
  Filled 2018-06-15 (×2): qty 2

## 2018-06-15 NOTE — Discharge Summary (Signed)
[facility] - [place] at [facility]   PATIENT NAME: Ashlee Harper    MR#:  [id number]  DATE OF BIRTH:  [birth year]  DATE OF ADMISSION:  06/12/2018 ADMITTING PHYSICIAN: Enid Baasadhika Kalisetti, MD  DATE OF DISCHARGE: 06/15/2018  PRIMARY CARE PHYSICIAN: Patient, No Pcp Per    ADMISSION DIAGNOSIS:  Left lower quadrant abdominal pain [R10.32] Sepsis, due to unspecified organism, unspecified whether acute organ dysfunction present (HCC) [A41.9]  DISCHARGE DIAGNOSIS:  E coli Sepsis E coli UTI  SECONDARY DIAGNOSIS:   Past Medical History:  Diagnosis Date  . Hypertension     HOSPITAL COURSE:   PhyllisWrightis a62 y.o.femalewith a known history of no significant past medical history, independent at baseline brought to the hospital by family secondary to worsening fatigue and weakness and chills.  1. Sepsis-secondary to bilateral pyelonephritis (noted on CT abdomen). -Blood cultures positive for E. coli. Sensitivity . And urine cultures has insignificant growth. -Patient now on IV Meropenem - received IV fluids -WBC count improving  2. Hypokalemia-being replaced with oral K  3. Mild renal insufficiency-ATN and prerenal causes -improving -pt drinking fluids -IV fluids, avoid nephrotoxins and monitor  4. DVT prophylaxis-Lovenox   Overall hemodynamically stable I have asked pt to monitor BP at home and get PCP in the area or [facility]  [facility]--pt agreeable CONSULTS OBTAINED:    DRUG ALLERGIES:  No Known Allergies  DISCHARGE MEDICATIONS:   Allergies as of 06/15/2018   No Known Allergies     Medication List    TAKE these medications   cephALEXin 500 MG capsule Commonly known as:  KEFLEX Take 1 capsule (500 mg total) by mouth every 6 (six) hours for 7 days.       If you experience worsening of your admission symptoms, develop shortness of breath, life threatening emergency, suicidal or homicidal thoughts you must seek  medical attention immediately by calling 911 or calling your MD immediately  if symptoms less severe.  You Must read complete instructions/literature along with all the possible adverse reactions/side effects for all the Medicines you take and that have been prescribed to you. Take any new Medicines after you have completely understood and accept all the possible adverse reactions/side effects.   Please note  You were cared for by a hospitalist during your hospital stay. If you have any questions about your discharge medications or the care you received while you were in the hospital after you are discharged, you can call the unit and asked to speak with the hospitalist on call if the hospitalist that took care of you is not available. Once you are discharged, your primary care physician will handle any further medical issues. Please note that NO REFILLS for any discharge medications will be authorized once you are discharged, as it is imperative that you return to your primary care physician (or establish a relationship with a primary care physician if you do not have one) for your aftercare needs so that they can reassess your need for medications and monitor your lab values. Today   SUBJECTIVE   No new complaints  VITAL SIGNS:  Blood pressure (!) 164/88, pulse 71, temperature 98.4 F (36.9 C), temperature source Oral, resp. rate 16, height 5\' 2"  (1.575 m), weight 59 kg, SpO2 100 %.  I/O:    Intake/Output Summary (Last 24 hours) at 06/15/2018 0841 Last data filed at 06/14/2018 1500 Gross per 24 hour  Intake 1649.48 ml  Output -  Net 1649.48 ml  PHYSICAL EXAMINATION:  GENERAL:  63 y.o.-year-old patient lying in the bed with no acute distress.  EYES: Pupils equal, round, reactive to light and accommodation. No scleral icterus. Extraocular muscles intact.  HEENT: Head atraumatic, normocephalic. Oropharynx and nasopharynx clear.  NECK:  Supple, no jugular venous distention. No thyroid  enlargement, no tenderness.  LUNGS: Normal breath sounds bilaterally, no wheezing, rales,rhonchi or crepitation. No use of accessory muscles of respiration.  CARDIOVASCULAR: S1, S2 normal. No murmurs, rubs, or gallops.  ABDOMEN: Soft, non-tender, non-distended. Bowel sounds present. No organomegaly or mass.  EXTREMITIES: No pedal edema, cyanosis, or clubbing.  NEUROLOGIC: Cranial nerves II through XII are intact. Muscle strength 5/5 in all extremities. Sensation intact. Gait not checked.  PSYCHIATRIC: The patient is alert and oriented x 3.  SKIN: No obvious rash, lesion, or ulcer.   DATA REVIEW:   CBC  Recent Labs  Lab 06/14/18 0816  WBC 15.7*  HGB 9.5*  HCT 28.9*  PLT 151    Chemistries  Recent Labs  Lab 06/12/18 0848  06/15/18 0232  NA 136   < > 140  K 2.8*   < > 3.2*  CL 103   < > 114*  CO2 23   < > 21*  GLUCOSE 160*   < > 93  BUN 20   < > 9  CREATININE 1.34*   < > 0.85  CALCIUM 8.8*   < > 7.9*  AST 43*  --   --   ALT 31  --   --   ALKPHOS 69  --   --   BILITOT 1.6*  --   --    < > = values in this interval not displayed.    Microbiology Results   Recent Results (from the past 240 hour(s))  Blood culture (single)     Status: Abnormal   Collection Time: 06/12/18 11:37 AM  Result Value Ref Range Status   Specimen Description   Final    BLOOD LEFT ANTECUBITAL Performed at [facility], [address], [place], [place] [address]    Special Requests   Final    BOTTLES DRAWN AEROBIC AND ANAEROBIC Blood Culture results may not be optimal due to an excessive volume of blood received in culture bottles Performed at [facility], [address], [place], [place] [address]    Culture  Setup Time   Final    GRAM NEGATIVE RODS IN BOTH AEROBIC AND ANAEROBIC BOTTLES CRITICAL RESULT CALLED TO, READ BACK BY AND VERIFIED WITH: MATT MCBANE @0041  06/13/18 AKT Performed at [facility], [address], [place], [place] [address]    Culture  ESCHERICHIA COLI (A)  Final   Report Status 06/15/2018 FINAL  Final   Organism ID, Bacteria ESCHERICHIA COLI  Final      Susceptibility   Escherichia coli - MIC*    AMPICILLIN <=2 SENSITIVE Sensitive     CEFAZOLIN <=4 SENSITIVE Sensitive     CEFEPIME <=1 SENSITIVE Sensitive     CEFTAZIDIME <=1 SENSITIVE Sensitive     CEFTRIAXONE <=1 SENSITIVE Sensitive     CIPROFLOXACIN <=0.25 SENSITIVE Sensitive     GENTAMICIN <=1 SENSITIVE Sensitive     IMIPENEM <=0.25 SENSITIVE Sensitive     TRIMETH/SULFA <=20 SENSITIVE Sensitive     AMPICILLIN/SULBACTAM <=2 SENSITIVE Sensitive     PIP/TAZO <=4 SENSITIVE Sensitive     Extended ESBL NEGATIVE Sensitive     * ESCHERICHIA COLI  Blood Culture ID Panel (Reflexed)  Status: Abnormal   Collection Time: 06/12/18 11:37 AM  Result Value Ref Range Status   Enterococcus species NOT DETECTED NOT DETECTED Final   Listeria monocytogenes NOT DETECTED NOT DETECTED Final   Staphylococcus species NOT DETECTED NOT DETECTED Final   Staphylococcus aureus (BCID) NOT DETECTED NOT DETECTED Final   Streptococcus species NOT DETECTED NOT DETECTED Final   Streptococcus agalactiae NOT DETECTED NOT DETECTED Final   Streptococcus pneumoniae NOT DETECTED NOT DETECTED Final   Streptococcus pyogenes NOT DETECTED NOT DETECTED Final   Acinetobacter baumannii NOT DETECTED NOT DETECTED Final   Enterobacteriaceae species DETECTED (A) NOT DETECTED Final    Comment: Enterobacteriaceae represent a large family of gram-negative bacteria, not a single organism. CRITICAL RESULT CALLED TO, READ BACK BY AND VERIFIED WITH: MATT MCBANE @0041  06/13/18 AKT    Enterobacter cloacae complex NOT DETECTED NOT DETECTED Final   Escherichia coli DETECTED (A) NOT DETECTED Final    Comment: CRITICAL RESULT CALLED TO, READ BACK BY AND VERIFIED WITH: MATT MCBANE @0041  06/13/18 AKT    Klebsiella oxytoca NOT DETECTED NOT DETECTED Final   Klebsiella pneumoniae NOT DETECTED NOT DETECTED Final   Proteus  species NOT DETECTED NOT DETECTED Final   Serratia marcescens NOT DETECTED NOT DETECTED Final   Carbapenem resistance NOT DETECTED NOT DETECTED Final   Haemophilus influenzae NOT DETECTED NOT DETECTED Final   Neisseria meningitidis NOT DETECTED NOT DETECTED Final   Pseudomonas aeruginosa NOT DETECTED NOT DETECTED Final   Candida albicans NOT DETECTED NOT DETECTED Final   Candida glabrata NOT DETECTED NOT DETECTED Final   Candida krusei NOT DETECTED NOT DETECTED Final   Candida parapsilosis NOT DETECTED NOT DETECTED Final   Candida tropicalis NOT DETECTED NOT DETECTED Final    Comment: Performed at [facility], [address] [place], [place] [address]  Urine culture     Status: Abnormal   Collection Time: 06/12/18  3:28 PM  Result Value Ref Range Status   Specimen Description   Final    URINE, RANDOM Performed at [facility], [address], [place], [place] [address]    Special Requests   Final    NONE Performed at [facility], [address], [place], [place] [address]    Culture (A)  Final    <10,000 COLONIES/mL INSIGNIFICANT GROWTH Performed at [facility], [address], [place], [place] [address]    Report Status 06/13/2018 FINAL  Final    RADIOLOGY:  No results found.   CODE STATUS:     Code Status Orders  (From admission, onward)         Start     Ordered   06/12/18 1812  Full code  Continuous     06/12/18 1811        Code Status History    This patient has a current code status but no historical code status.      TOTAL TIME TAKING CARE OF THIS PATIENT: 40 minutes.    Enedina Finner M.D on 06/15/2018 at 8:41 AM  Between 7am to 6pm - Pager - [phone] After 6pm go to [email or web address] - password [email or web address]  [facility]  Office  [phone]  CC: Primary care physician; Patient, No Pcp Per

## 2018-06-15 NOTE — Progress Notes (Signed)
Patient discharged home per MD order. All discharge instructions given and all questions answered. 

## 2018-06-15 NOTE — Discharge Instructions (Signed)
[facility]

## 2018-06-15 NOTE — Care Management Note (Addendum)
Case Management Note  Patient Details  Name: Armando Ullery MRN: [id number] Date of Birth: December 03, 1955  Subjective/Objective:   Admitted to [facility] with the diagnosis of sepsis. Lives with husband, Velda Shell. Son is Domick [phone]). No primary care physician. No Drug Store. Lives in [place].  Takes care of all basic activities of daily living herself. Doesn't drive.  Daughter-in-law will transport.    Patient's telephone [phone]               Action/Plan: Application to [facility] and Medication Management given, Referral to [facility] done.    Expected Discharge Date:  06/15/18               Expected Discharge Plan:     In-House Referral:   yes  Discharge planning Services   yes  Post Acute Care Choice:    Choice offered to:     DME Arranged:    DME Agency:     HH Arranged:    HH Agency:     Status of Service:     If discussed at [employer], dates discussed:    Additional Comments:  Gwenette Greet, RN MSN CCM Care Management [phone] 06/15/2018, 9:26 AM

## 2020-02-23 IMAGING — CT CT ABD-PELV W/ CM
2 of 5 series · 16 of 46 positions shown, 18 images · IV contrast (APPLIED)
Comparison: None.

CLINICAL DATA: Left-sided pain for 2 days. Denies urinary symptoms.
Normal bowel movements. Current smoker.

EXAM:
CT ABDOMEN AND PELVIS WITH CONTRAST
TECHNIQUE: Multidetector CT imaging of the abdomen and pelvis was performed
using the standard protocol following bolus administration of
intravenous contrast.
CONTRAST:  75mL OMNIPAQUE IOHEXOL 300 MG/ML  SOLN

[Series 2: routine abd/pel with · axial · 0.66mm/px · z∈[-446,-56]mm · 13 of 88 slices shown, 15 images]
[im 5/88  soft-tissue]
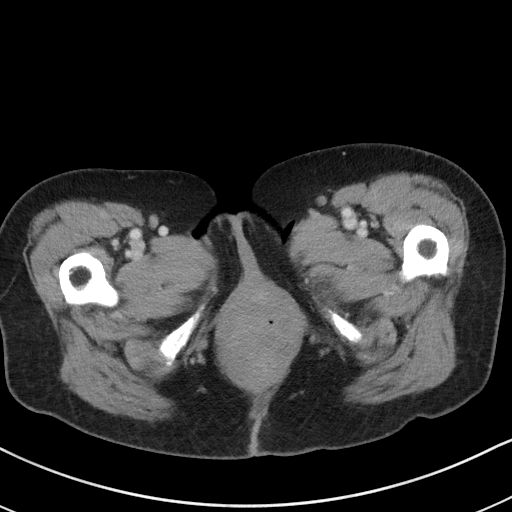
[im 5/88  bone]
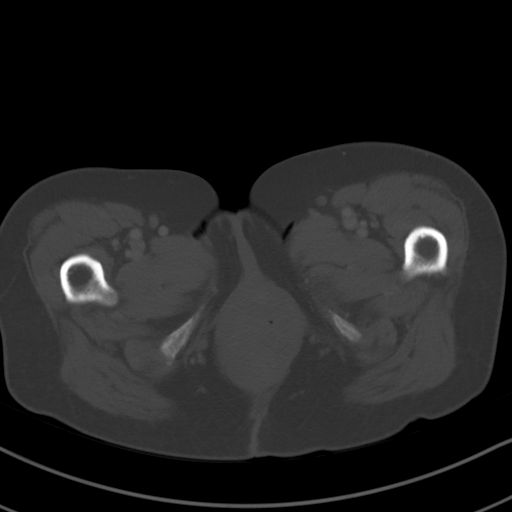
[im 14/88  soft-tissue]
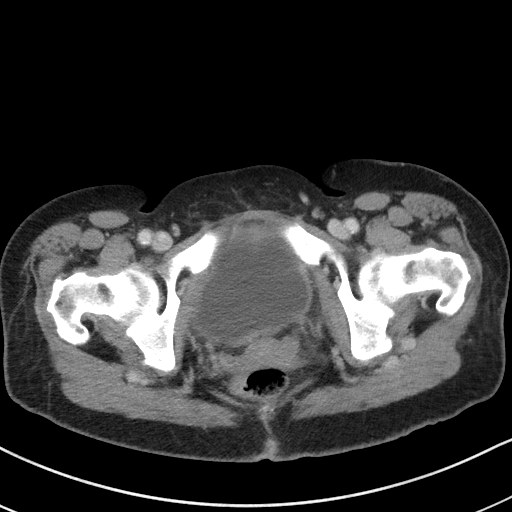
[im 19/88  soft-tissue]
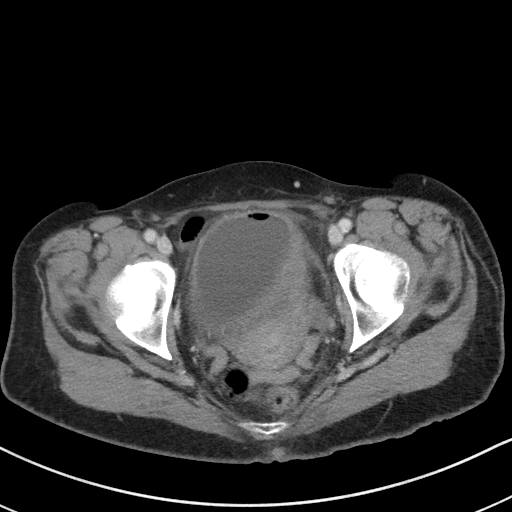
[im 23/88  soft-tissue]
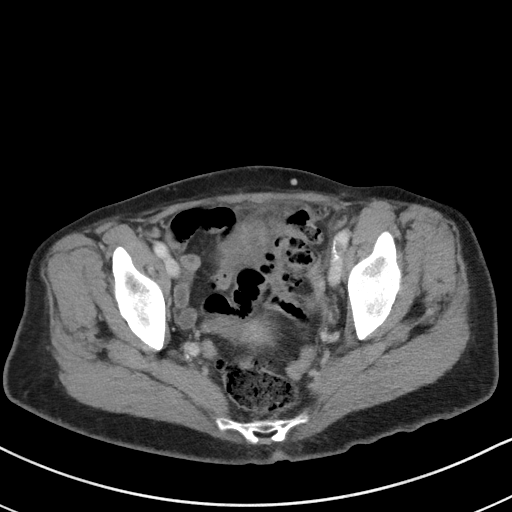
[im 33/88  soft-tissue]
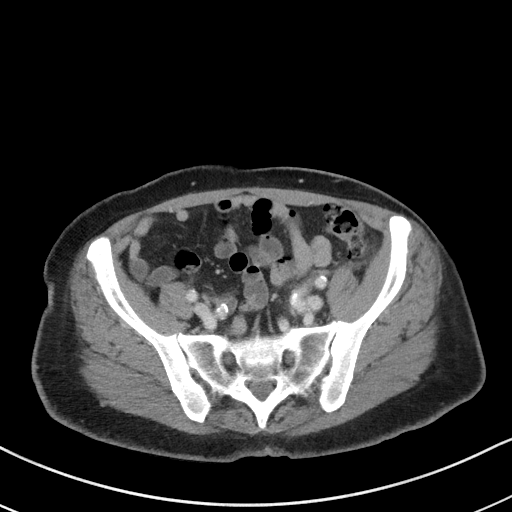
[im 37/88  soft-tissue]
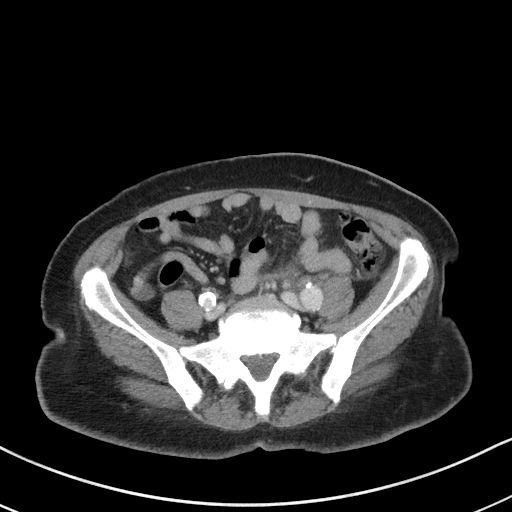
[im 46/88  soft-tissue]
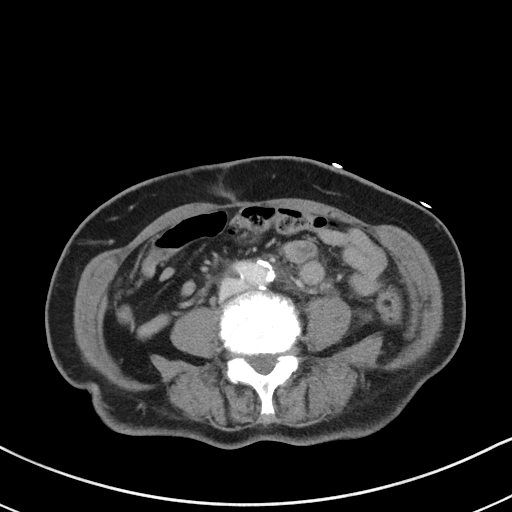
[im 51/88  soft-tissue]
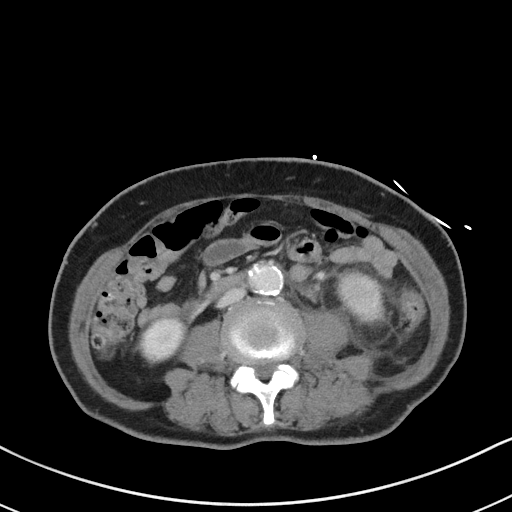
[im 55/88  soft-tissue]
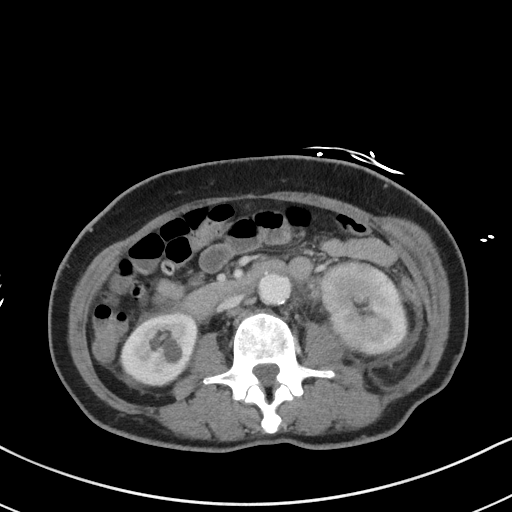
[im 55/88  bone]
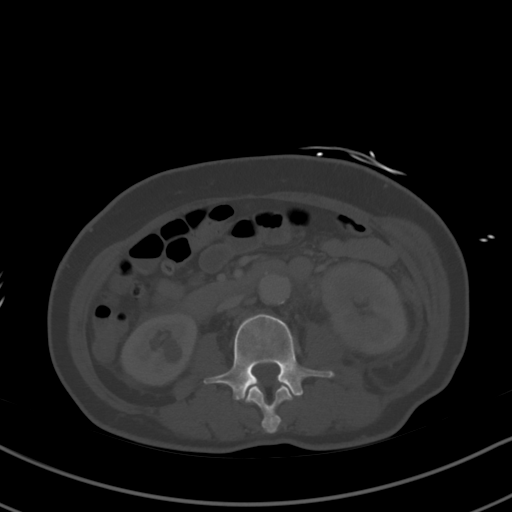
[im 65/88  soft-tissue]
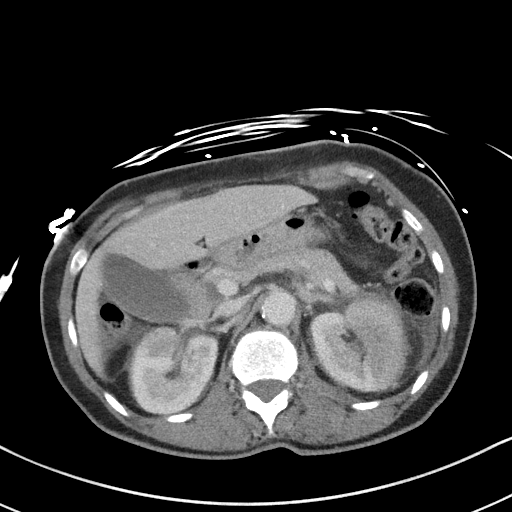
[im 69/88  soft-tissue]
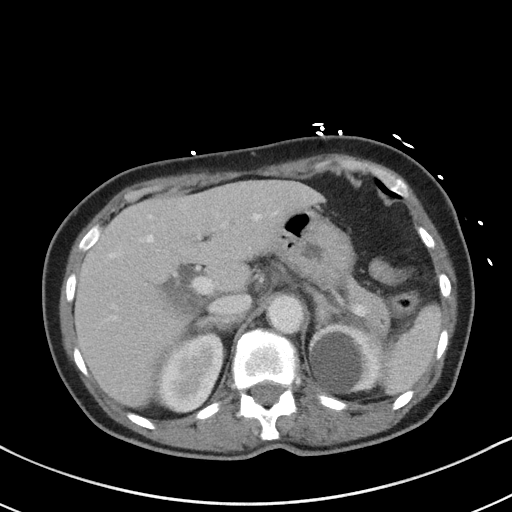
[im 74/88  soft-tissue]
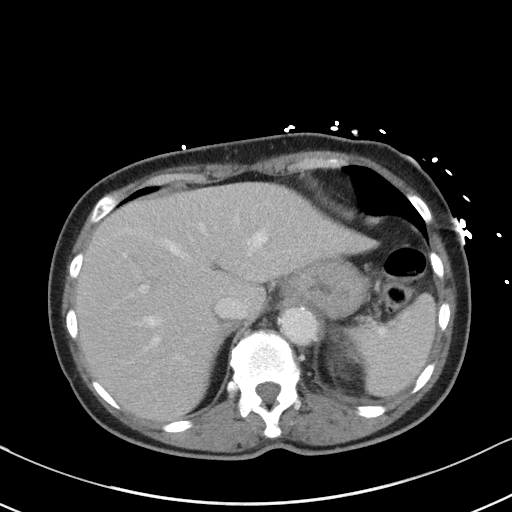
[im 83/88  soft-tissue]
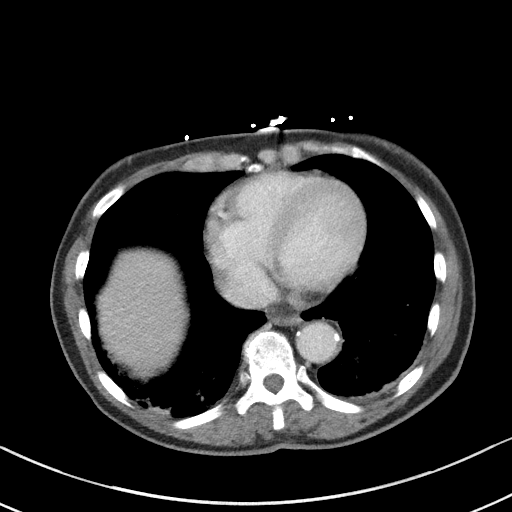

[Series 5: coronal st · coronal · 0.72mm/px · 3 of 76 slices shown]
[im 26/76  soft-tissue]
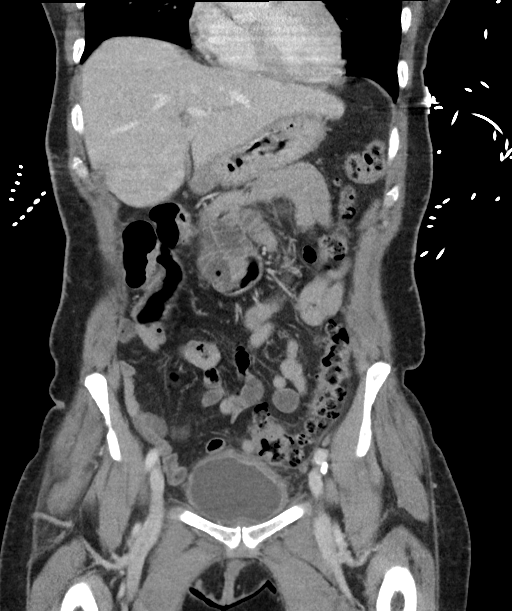
[im 34/76  soft-tissue]
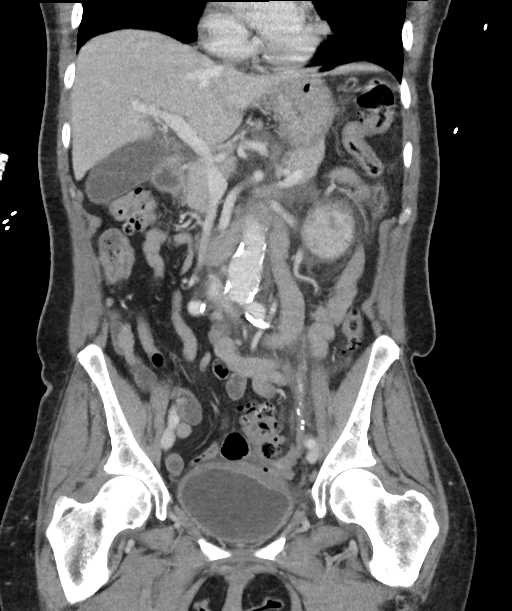
[im 42/76  soft-tissue]
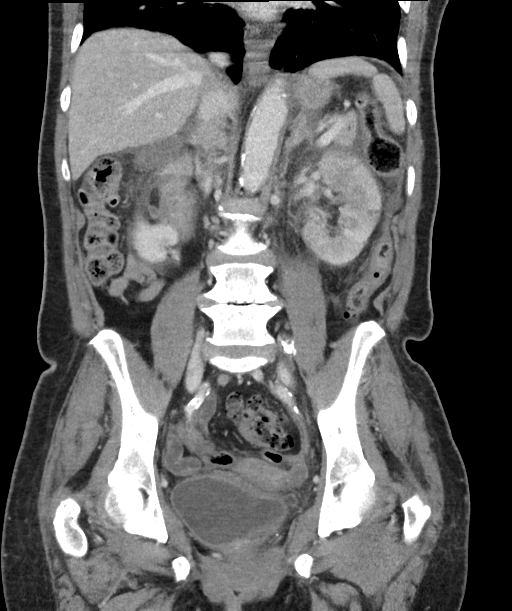

[16 of 46 positions shown; findings below may reference images not displayed]

FINDINGS: Lower chest: Bibasilar subsegmental atelectasis. Normal heart size
with trace left pleural fluid.

Hepatobiliary: Normal liver. Normal gallbladder, without biliary
duct dilatation.

Pancreas: Normal, without mass or ductal dilatation.

Spleen: Normal in size, without focal abnormality.

Adrenals/Urinary Tract: Mild left greater than right adrenal
thickening. Lower pole right renal 1.3 cm cyst.

Upper pole left renal 4.0 cm cyst. Edema of the left kidney, with
relatively diffuse heterogeneous enhancement. Most apparent on
delayed images.

More mild heterogeneous enhancement of the right kidney, including
anteriorly on image [DATE].

Left perinephric interstitial thickening.  No hydronephrosis.

Air within the urinary bladder with diffuse mild pelvic edema.
Example image 69/2. Bladder dome wall thickening, eccentric left on
coronal image 30.

Stomach/Bowel: Normal stomach, without wall thickening. Colonic
diverticulosis, especially in the sigmoid. Possible extraluminal gas
between the sigmoid and bladder on image 67/2. Normal small bowel.

Vascular/Lymphatic: Advanced aortic and branch vessel
atherosclerosis. No abdominopelvic adenopathy.

Reproductive: Normal uterus and adnexa.

Other: No significant free fluid.  Moderate pelvic floor laxity.

Musculoskeletal: Lumbosacral spondylosis, including degenerative
disc disease at L4-5 and a disc bulge at L3-4.
IMPRESSION: 1. Left greater than right pyelonephritis.
2. Bladder wall thickening and diffuse pelvic edema, likely
indicative of cystitis. Air within the bladder. Unless there has
been recent instrumentation, this would suggest fistulous
communication to bowel, presumably secondary to diverticulitis.
3.  Aortic Atherosclerosis ([id number]).
4. Pelvic floor laxity.

## 2021-11-11 ENCOUNTER — Encounter (HOSPITAL_COMMUNITY): Payer: Self-pay | Admitting: Emergency Medicine

## 2021-11-11 ENCOUNTER — Emergency Department (HOSPITAL_COMMUNITY)
Admission: EM | Admit: 2021-11-11 | Discharge: 2021-11-11 | Disposition: A | Discharge status: Home / Self Care | Payer: Self-pay | Attending: Student | Admitting: Student

## 2021-11-11 ENCOUNTER — Emergency Department (HOSPITAL_COMMUNITY): Payer: Self-pay

## 2021-11-11 DIAGNOSIS — S39012A Strain of muscle, fascia and tendon of lower back, initial encounter: Secondary | ICD-10-CM | POA: Insufficient documentation

## 2021-11-11 DIAGNOSIS — I10 Essential (primary) hypertension: Secondary | ICD-10-CM | POA: Insufficient documentation

## 2021-11-11 DIAGNOSIS — X501XXA Overexertion from prolonged static or awkward postures, initial encounter: Secondary | ICD-10-CM | POA: Insufficient documentation

## 2021-11-11 LAB — URINALYSIS, ROUTINE W REFLEX MICROSCOPIC
Bilirubin Urine: NEGATIVE
Glucose, UA: NEGATIVE mg/dL
Ketones, ur: NEGATIVE mg/dL
Leukocytes,Ua: NEGATIVE
Nitrite: NEGATIVE
Protein, ur: 30 mg/dL — AB
Specific Gravity, Urine: 1.008 (ref 1.005–1.030)
pH: 7 (ref 5.0–8.0)

## 2021-11-11 MED ORDER — METHOCARBAMOL 500 MG PO TABS: 500.0000 mg | ORAL_TABLET | Freq: Every evening | ORAL | 0 refills | Status: AC | PRN

## 2021-11-11 MED ORDER — METHOCARBAMOL 500 MG PO TABS: 500.0000 mg | ORAL_TABLET | Freq: Every evening | ORAL | 0 refills | Status: DC | PRN

## 2021-11-11 MED ORDER — MELOXICAM 15 MG PO TABS: 15.0000 mg | ORAL_TABLET | Freq: Every day | ORAL | 0 refills | Status: DC

## 2021-11-11 MED ORDER — MELOXICAM 15 MG PO TABS: 15.0000 mg | ORAL_TABLET | Freq: Every day | ORAL | 0 refills | Status: AC

## 2021-11-11 NOTE — ED Triage Notes (Signed)
Patient here from home reporting lower back pain, increased on left side. Denies urinary symptoms.

## 2021-11-11 NOTE — ED Notes (Signed)
Reports no hx of hypertension, but BP elevated in triage with documented hx in chart

## 2021-11-11 NOTE — ED Provider Notes (Signed)
[facility]GENC[facility] DEPT Provider Note   CSN: [id number] Arrival date & time: 11/11/21  1125     History  Chief Complaint  Patient presents with   Back Pain    Ashlee Harper is a 66 y.o. female.  Patient presents with 1 day of low back pain on the right side.  Patient says she was lying in bed last night and began to have sharp pains on the right side in her lower back.  Patient denies any known injury, any increase in activity, any aggravating factor prior to the onset of pain.  She denies any radicular symptom, urinary or fecal incontinence, saddle anesthesia, abdominal pain, dysuria, hematuria, nausea, vomiting, shortness of breath, chest pain.  The patient does have a noted history of hypertension which is not treated.  Patient does not have a PCP.  Past medical history significant for hypertension, sepsis secondary to bilateral pyelonephritis  HPI     Home Medications Prior to Admission medications   Medication Sig Start Date End Date Taking? Authorizing Provider  meloxicam (MOBIC) 15 MG tablet Take 1 tablet (15 mg total) by mouth daily. 11/11/21 12/11/21 Yes Darrick Grinder, PA-C  methocarbamol (ROBAXIN) 500 MG tablet Take 1 tablet (500 mg total) by mouth at bedtime as needed for muscle spasms. 11/11/21  Yes Darrick Grinder, PA-C      Allergies    Patient has no known allergies.    Review of Systems   Review of Systems  Constitutional:  Negative for fever.  Respiratory:  Negative for shortness of breath.   Cardiovascular:  Negative for chest pain.  Gastrointestinal:  Negative for abdominal pain, constipation, diarrhea, nausea and vomiting.  Genitourinary:  Negative for dysuria.  Musculoskeletal:  Positive for back pain.    Physical Exam Updated Vital Signs BP (!) 192/110   Pulse 68   Temp 98 F (36.7 C) (Oral)   Resp 18   Ht 5\' 2"  (1.575 m)   Wt 49.9 kg   SpO2 99%   BMI 20.12 kg/m  Physical Exam Vitals and nursing note reviewed.   Constitutional:      General: She is not in acute distress.    Appearance: She is normal weight.  HENT:     Head: Normocephalic and atraumatic.     Mouth/Throat:     Mouth: Mucous membranes are moist.  Eyes:     Conjunctiva/sclera: Conjunctivae normal.  Cardiovascular:     Rate and Rhythm: Normal rate and regular rhythm.     Pulses: Normal pulses.     Heart sounds: Normal heart sounds.  Pulmonary:     Effort: Pulmonary effort is normal.     Breath sounds: Normal breath sounds.  Abdominal:     Palpations: Abdomen is soft.     Tenderness: There is no abdominal tenderness. There is no right CVA tenderness or left CVA tenderness.  Musculoskeletal:        General: Tenderness present. No swelling or deformity.     Cervical back: Normal range of motion and neck supple.     Comments: Mild tenderness to palpation of the lower back on the right side.   Skin:    General: Skin is warm and dry.     Capillary Refill: Capillary refill takes less than 2 seconds.  Neurological:     Mental Status: She is alert and oriented to person, place, and time.     ED Results / Procedures / Treatments   Labs (all labs ordered  are listed, but only abnormal results are displayed) Labs Reviewed  URINALYSIS, ROUTINE W REFLEX MICROSCOPIC - Abnormal; Notable for the following components:      Result Value   Color, Urine STRAW (*)    Hgb urine dipstick MODERATE (*)    Protein, ur 30 (*)    Bacteria, UA RARE (*)    All other components within normal limits    EKG None  Radiology CT Renal Stone Study  Result Date: 11/11/2021 CLINICAL DATA:  Right flank pain EXAM: CT ABDOMEN AND PELVIS WITHOUT CONTRAST TECHNIQUE: Multidetector CT imaging of the abdomen and pelvis was performed following the standard protocol without IV contrast. RADIATION DOSE REDUCTION: This exam was performed according to the departmental dose-optimization program which includes automated exposure control, adjustment of the mA and/or  kV according to patient size and/or use of iterative reconstruction technique. COMPARISON:  06/12/2018 FINDINGS: Lower chest: Heart is enlarged in size. Coronary artery calcifications are seen. Hepatobiliary: No focal abnormalities are seen in liver. There is no dilation of bile ducts. Gallbladder is unremarkable there Pancreas: No focal abnormalities are seen. Spleen: Unremarkable. Adrenals/Urinary Tract: There is mild hyperplasia of the adrenals with no significant interval change. There is no hydronephrosis. There is 4.8 cm cyst in the upper pole of left kidney. There is possible 1.6 cm cyst in the lower pole of right kidney. There is no perinephric fluid collection. Ureters are not dilated. Urinary bladder is not distended. Stomach/Bowel: Stomach is unremarkable. Small bowel loops are not dilated. Appendix is not seen. There is no significant wall thickening in colon. Multiple diverticula are seen in the colon without signs of focal diverticulitis. Vascular/Lymphatic: Scattered coarse calcifications are seen in the aorta and its major branches. 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 Reproductive: Unremarkable. Other: There is no ascites or pneumoperitoneum. Musculoskeletal: Degenerative changes are noted in lumbar spine, particularly severe at L4-L5 level. IMPRESSION: There is no evidence of intestinal obstruction or pneumoperitoneum. There is no hydronephrosis. 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 Recommend follow-up every 3 years.Reference: J Am Coll Radiol 2013;10:789-794. Diverticulosis of colon without signs of focal diverticulitis. Renal cysts. Other findings as described in the body of the report. Electronically Signed   By: Ernie Avena M.D.   On: 11/11/2021 14:35    Procedures Procedures    Medications Ordered in ED Medications - No data to display  ED Course/ Medical Decision Making/ A&P                           Medical Decision  Making Amount and/or Complexity of Data Reviewed Labs: ordered. Radiology: ordered.   The patient presents with chief complaint of lower right back pain.  Differential includes but is not limited to lumbar strain, pyelonephritis, nephrolithiasis, and others  I reviewed the patient's medical history including admission of years ago for sepsis due to bilateral pyelonephritis  I ordered and reviewed lab results.  Moderate hemoglobin noted on urinalysis.  I ordered and personally interpreted imaging including a CT renal stone study.  No evidence of intestinal obstruction or pneumoperitoneum.  No hydronephrosis.  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  Diverticulosis of colon without signs of focal diverticulitis.  Renal cyst.  I agree with the radiologist findings  The patient's presentation is consistent with a lumbar strain.  No sign of kidney involvement on CT renal stone study.  The  patient's hypertension appears to be chronic.  No signs at this time endorgan dysfunction due to her hypertension.  Plan to have her follow-up with her primary care provider for management of her hypertension and other underlying chronic medical conditions.  I will discharge the patient home with an anti-inflammatory to be taken daily and a muscle relaxant to be used at night, and instructions on icing and using heat on the area.  Patient also instructed to rest the area.  Discharge home        Final Clinical Impression(s) / ED Diagnoses Final diagnoses:  Strain of lumbar region, initial encounter  Hypertension, unspecified type    Rx / DC Orders ED Discharge Orders          Ordered    meloxicam (MOBIC) 15 MG tablet  Daily        11/11/21 1512    methocarbamol (ROBAXIN) 500 MG tablet  At bedtime PRN        11/11/21 1512              Pamala Duffel 11/11/21 1521    Kommor, Wyn Forster, MD 11/12/21 1049

## 2021-11-11 NOTE — ED Provider Triage Note (Signed)
Emergency Medicine Provider Triage Evaluation Note  Ashlee Harper , a 66 y.o. female  was evaluated in triage.  Pt complains of right-sided back pain which began last night while lying in bed.  She denies any known trauma or injury.  Denies urinary or fecal incontinence.  Patient also denies saddle anesthesia.  Review of Systems  Positive: Back pain Negative: Abdominal pain, nausea, vomiting, headache  Physical Exam  BP (!) 209/132 (BP Location: Left Arm)   Pulse 86   Temp 98 F (36.7 C) (Oral)   Resp 19   SpO2 99%  Gen:   Awake, no distress   Resp:  Normal effort  MSK:   Moves extremities without difficulty  Other:    Medical Decision Making  Medically screening exam initiated at 11:54 AM.  Appropriate orders placed.  Ashlee Harper was informed that the remainder of the evaluation will be completed by another provider, this initial triage assessment does not replace that evaluation, and the importance of remaining in the ED until their evaluation is complete.     Darrick Grinder, PA-C 11/11/21 1156

## 2021-11-11 NOTE — Discharge Instructions (Addendum)
You were seen today for pain in the back which appears to be a lumbar strain.  I prescribed an anti-inflammatory, meloxicam, which to be taken daily.  Do not take other NSAID medication while taking this medication.  You may still take Tylenol for breakthrough pain.  I have prescribed a muscle relaxant which is to be taken only at night prior to bed.  Do not drive while taking the medication.  It will cause drowsiness.  You may use ice or heat as tolerated on the area.  Rest is important to the healing process.  I have provided a number for [facility] to help you establish primary care.  You need primary care to help manage your hypertension.  Your blood pressure is well above recommended ranges
